# Patient Record
Sex: Male | Born: 1990 | Race: Black or African American | Hispanic: No | Marital: Single | Smoking: Never smoker
Health system: Southern US, Community
[De-identification: ages and names within clinical notes are randomized; demographics above are authoritative.]

## PROBLEM LIST (undated history)

## (undated) HISTORY — PX: FRACTURE SURGERY: SHX138

---

## 2017-05-18 ENCOUNTER — Emergency Department (HOSPITAL_COMMUNITY): Payer: Self-pay

## 2017-05-18 ENCOUNTER — Encounter (HOSPITAL_COMMUNITY): Payer: Self-pay | Admitting: Emergency Medicine

## 2017-05-18 ENCOUNTER — Emergency Department (HOSPITAL_COMMUNITY)
Admission: EM | Admit: 2017-05-18 | Discharge: 2017-05-18 | Disposition: A | Discharge status: Home / Self Care | Payer: Self-pay | Attending: Emergency Medicine | Admitting: Emergency Medicine

## 2017-05-18 DIAGNOSIS — S63502A Unspecified sprain of left wrist, initial encounter: Secondary | ICD-10-CM | POA: Insufficient documentation

## 2017-05-18 DIAGNOSIS — Y999 Unspecified external cause status: Secondary | ICD-10-CM | POA: Insufficient documentation

## 2017-05-18 DIAGNOSIS — Y929 Unspecified place or not applicable: Secondary | ICD-10-CM | POA: Insufficient documentation

## 2017-05-18 DIAGNOSIS — Y9367 Activity, basketball: Secondary | ICD-10-CM | POA: Insufficient documentation

## 2017-05-18 DIAGNOSIS — W500XXA Accidental hit or strike by another person, initial encounter: Secondary | ICD-10-CM | POA: Insufficient documentation

## 2017-05-18 MED ORDER — IBUPROFEN 200 MG PO TABS
600.0000 mg | ORAL_TABLET | Freq: Once | ORAL | Status: AC
Start: 2017-05-18 — End: 2017-05-18
  Administered 2017-05-18: 600 mg via ORAL
  Filled 2017-05-18: qty 3

## 2017-05-18 MED ORDER — IBUPROFEN 600 MG PO TABS: 600.0000 mg | ORAL_TABLET | Freq: Four times a day (QID) | ORAL | 0 refills | Status: AC | PRN

## 2017-05-18 NOTE — ED Notes (Signed)
Pt to xray

## 2017-05-18 NOTE — ED Triage Notes (Signed)
Pt from home with complaints of left wrist and hand pain following a basketball injury 1 week ago. Pt has decreased ROM and mild wrist swelling

## 2017-05-18 NOTE — ED Provider Notes (Signed)
[facility]-EMERGENCY DEPT Provider Note   CSN: [id number] Arrival date & time: 05/18/17  2122     History   Chief Complaint Chief Complaint  Patient presents with  . Hand Injury    HPI Nithin Dutch is a 26 y.o. male.  HPI   Patient is a 26 year old male with no pertinent past medical history presents the ED with complaint of pain to his left wrist and hand, onset one week. Patient reports while he was playing basketball 1 week ago he tried to block a ball and states he was hit by another player's arm to his left hand and wrist. He states since then he has had gradually worsening pain. 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 He reports having mild swelling to his left hand earlier this week which has since resolved. Reports taking Tylenol at home with intermittent relief. Reports pain is worse with movement of his wrist. Denies any prior injuries or surgeries to his left hand/wrist. Denies fever, redness, numbness, weakness, wound.  History reviewed. No pertinent past medical history.  There are no active problems to display for this patient.   Past Surgical History:  Procedure Laterality Date  . FRACTURE SURGERY         Home Medications    Prior to Admission medications   Medication Sig Start Date End Date Taking? Authorizing Provider  ibuprofen (ADVIL,MOTRIN) 600 MG tablet Take 1 tablet (600 mg total) by mouth every 6 (six) hours as needed. 05/18/17   Barrett Henle, PA-C    Family History No family history on file.  Social History Social History  Substance Use Topics  . Smoking status: Never Smoker  . Smokeless tobacco: Not on file  . Alcohol use No     Allergies   Patient has no known allergies.   Review of Systems Review of Systems  Constitutional: Negative for fever.  Musculoskeletal: Positive for arthralgias (left hand/wrist) and joint swelling.  Skin:  Negative for wound.  Neurological: Negative for weakness and numbness.     Physical Exam Updated Vital Signs BP 138/80   Pulse 84   Temp 97.5 F (36.4 C) (Oral)   SpO2 100%   Physical Exam  Constitutional: He is oriented to person, place, and time. He appears well-developed and well-nourished.  HENT:  Head: Normocephalic and atraumatic.  Eyes: Conjunctivae and EOM are normal. Right eye exhibits no discharge. Left eye exhibits no discharge. No scleral icterus.  Neck: Normal range of motion. Neck supple.  Cardiovascular: Normal rate and intact distal pulses.   Pulmonary/Chest: Effort normal.  Musculoskeletal: He exhibits tenderness. He exhibits no edema or deformity.  Mild TTP over medial aspect of left wrist and distal half of forearm without swelling, erythema, warmth, abrasion,  Contusion or deformity. Dec ROM of left wrist with full flexion and extension due to reported pain. FROM of left digits and hand, equal grip strength bilaterally. FROM of left forearm and elbow. Sensation grossly intact. 2+ radial pulse. Cap refill less than 2.  Neurological: He is alert and oriented to person, place, and time.  Skin: Skin is warm and dry. Capillary refill takes less than 2 seconds.  Nursing note and vitals reviewed.    ED Treatments / Results  Labs (all labs ordered are listed, but only abnormal results are displayed) Labs Reviewed - No data to display  EKG  EKG Interpretation None  Radiology Dg Forearm Left  Result Date: 05/18/2017 CLINICAL DATA:  Basketball injury EXAM: LEFT FOREARM - 2 VIEW COMPARISON:  None. FINDINGS: There is no evidence of fracture or other focal bone lesions. Soft tissues are unremarkable. IMPRESSION: No fracture or dislocation of the left forearm. Electronically Signed   By: Deatra RobinsonKevin  Herman M.D.   On: 05/18/2017 22:29   Dg Hand Complete Left  Result Date: 05/18/2017 CLINICAL DATA:  Basketball injury EXAM: LEFT HAND - COMPLETE 3+ VIEW COMPARISON:   None. FINDINGS: There is no evidence of fracture or dislocation. There is no evidence of arthropathy or other focal bone abnormality. Soft tissues are unremarkable. IMPRESSION: No fracture or dislocation of the left hand. Electronically Signed   By: Deatra RobinsonKevin  Herman M.D.   On: 05/18/2017 22:29    Procedures Procedures (including critical care time)  Medications Ordered in ED Medications  ibuprofen (ADVIL,MOTRIN) tablet 600 mg (600 mg Oral Given 05/18/17 2202)     Initial Impression / Assessment and Plan / ED Course  I have reviewed the triage vital signs and the nursing notes.  Pertinent labs & imaging results that were available during my care of the patient were reviewed by me and considered in my medical decision making (see chart for details).     Patient X-Ray negative for obvious fracture or dislocation. Suspect sxs due to left wrist sprain. Pt advised to follow up with PCP if symptoms persist. Patient given ACE wrap while in ED, conservative therapy recommended and discussed. Patient will be dc home & is agreeable with above plan.   Final Clinical Impressions(s) / ED Diagnoses   Final diagnoses:  Sprain of left wrist, initial encounter    New Prescriptions New Prescriptions   IBUPROFEN (ADVIL,MOTRIN) 600 MG TABLET    Take 1 tablet (600 mg total) by mouth every 6 (six) hours as needed.     Barrett Henleadeau, Advith Martine Elizabeth, PA-C 05/18/17 2258    Lorre NickAllen, Anthony, MD 05/19/17 2342

## 2017-05-18 NOTE — Discharge Instructions (Signed)
Take your medication as prescribed as needed for pain and swelling. Continue to rest, elevate and apply ice to affected area for 15-20 minutes 3-4 times daily. He may continue wearing your Ace wrap as needed for comfort. Please follow up with a primary care provider from the Resource Guide provided below in 1 week as needed. Please return to the Emergency Department if symptoms worsen or new onset of Fever, redness, swelling, warmth, numbness, weakness, decreased range of motion.

## 2018-02-11 IMAGING — CR DG HAND COMPLETE 3+V*L*
3 series · 3 of 3 positions shown · non-contrast
Comparison: None.

CLINICAL DATA: Basketball injury

EXAM:
LEFT HAND - COMPLETE 3+ VIEW

[x hand pa left]
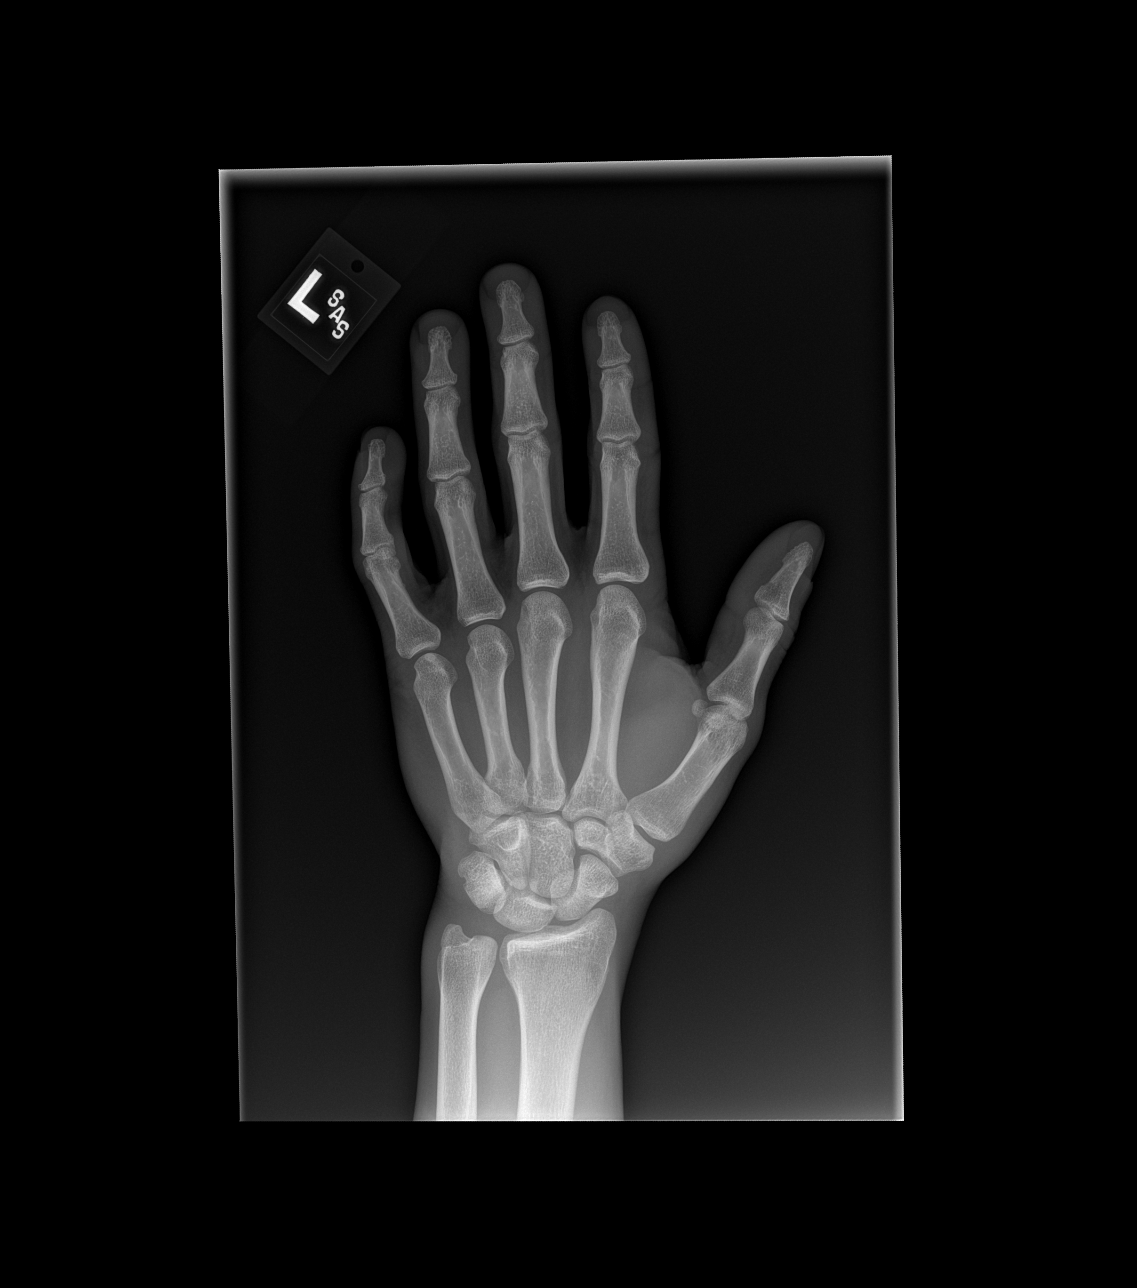

[x hand obl left]
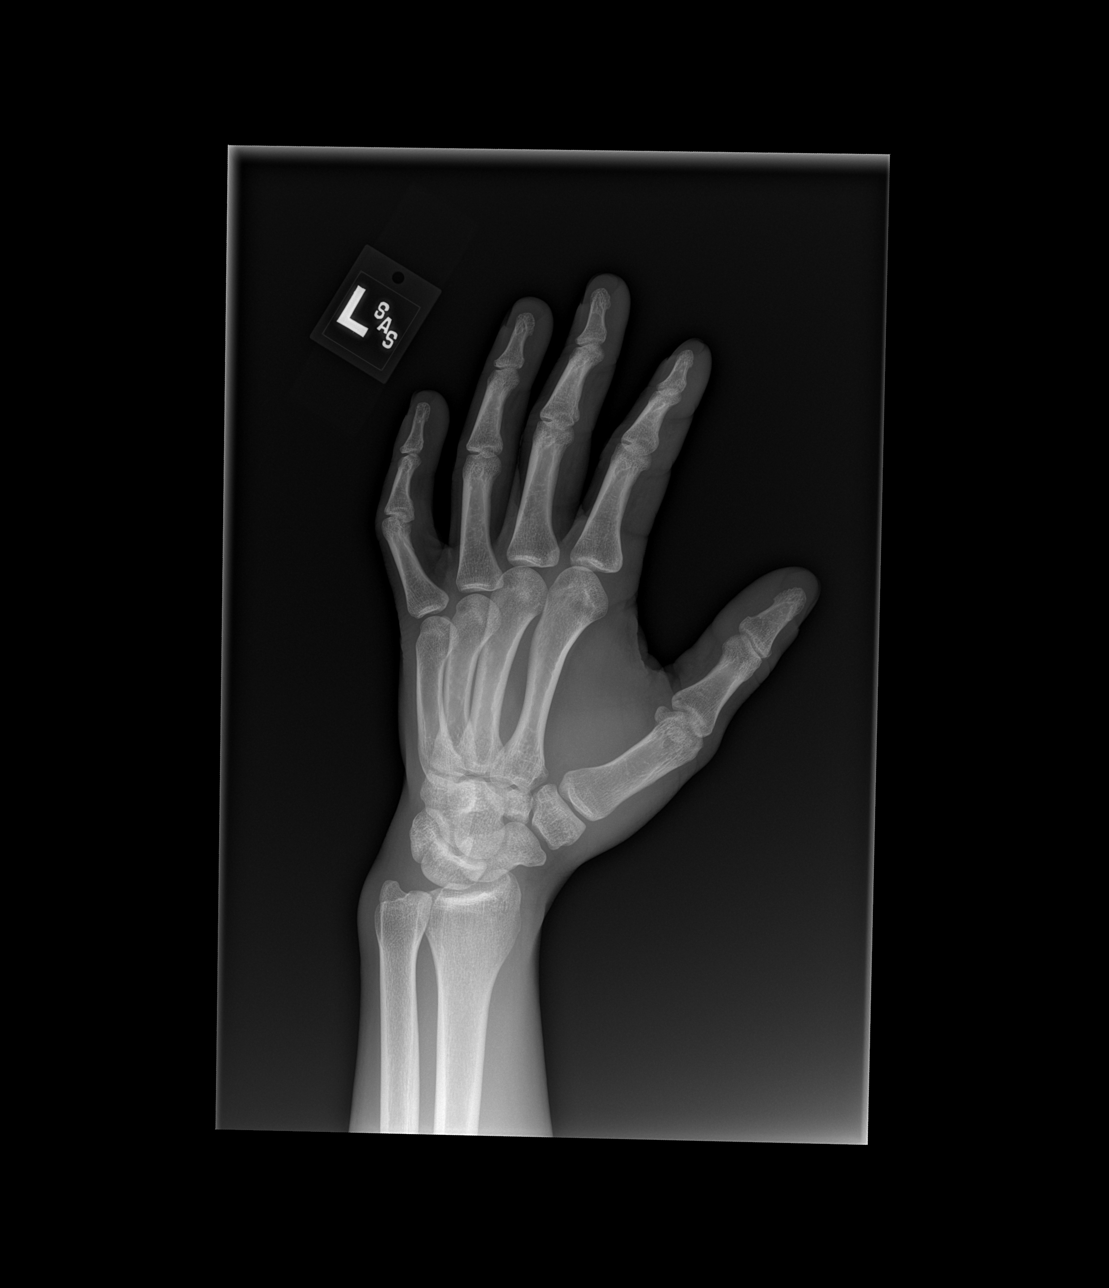

[x hand lat left]
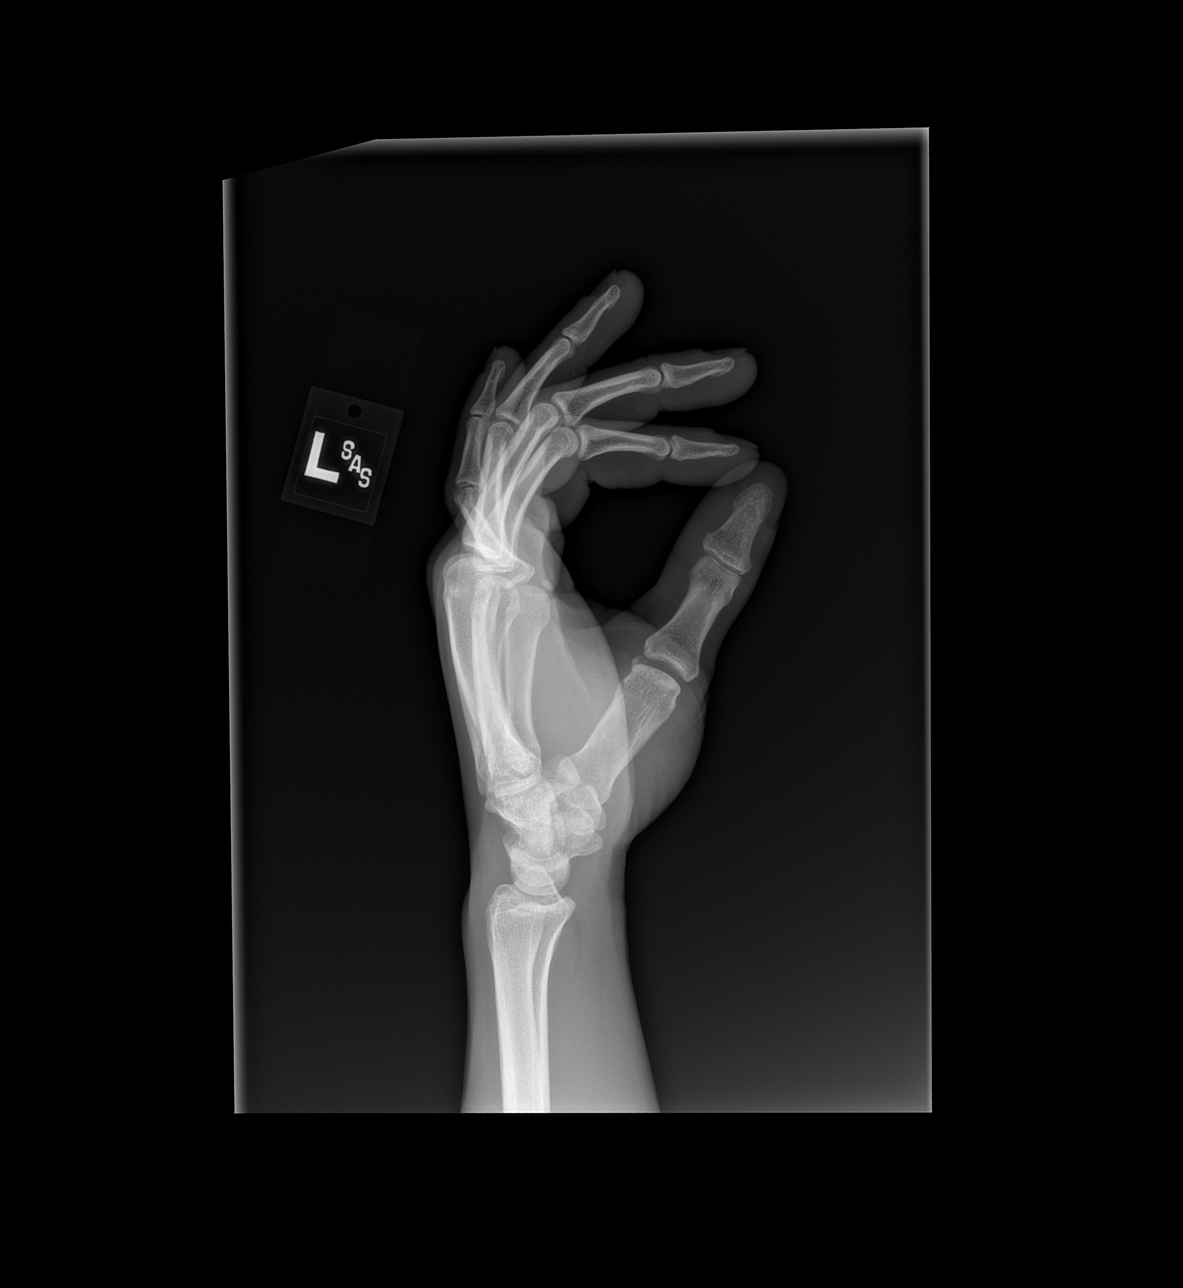

[3 of 3 positions shown; findings below may reference images not displayed]

FINDINGS: There is no evidence of fracture or dislocation. There is no
evidence of arthropathy or other focal bone abnormality. Soft
tissues are unremarkable.
IMPRESSION: No fracture or dislocation of the left hand.
